# Patient Record
Sex: Male | Born: 1965 | Race: Asian | Hispanic: No | Marital: Single
Health system: Southern US, Community
[De-identification: ages and names within clinical notes are randomized; demographics above are authoritative.]

---

## 2013-02-13 DIAGNOSIS — I498 Other specified cardiac arrhythmias: Secondary | ICD-10-CM | POA: Diagnosis not present

## 2015-04-20 DIAGNOSIS — F209 Schizophrenia, unspecified: Secondary | ICD-10-CM | POA: Diagnosis not present

## 2015-05-06 DIAGNOSIS — M542 Cervicalgia: Secondary | ICD-10-CM | POA: Diagnosis not present

## 2015-05-06 DIAGNOSIS — M50323 Other cervical disc degeneration at C6-C7 level: Secondary | ICD-10-CM | POA: Diagnosis not present

## 2015-05-06 DIAGNOSIS — M47812 Spondylosis without myelopathy or radiculopathy, cervical region: Secondary | ICD-10-CM | POA: Diagnosis not present

## 2015-05-17 DIAGNOSIS — J302 Other seasonal allergic rhinitis: Secondary | ICD-10-CM | POA: Diagnosis not present

## 2015-07-22 DIAGNOSIS — H04123 Dry eye syndrome of bilateral lacrimal glands: Secondary | ICD-10-CM | POA: Diagnosis not present

## 2015-07-22 DIAGNOSIS — H43393 Other vitreous opacities, bilateral: Secondary | ICD-10-CM | POA: Diagnosis not present

## 2015-07-22 DIAGNOSIS — H33311 Horseshoe tear of retina without detachment, right eye: Secondary | ICD-10-CM | POA: Diagnosis not present

## 2015-07-22 DIAGNOSIS — H269 Unspecified cataract: Secondary | ICD-10-CM | POA: Diagnosis not present

## 2015-10-07 DIAGNOSIS — F250 Schizoaffective disorder, bipolar type: Secondary | ICD-10-CM | POA: Diagnosis not present

## 2015-10-24 DIAGNOSIS — F250 Schizoaffective disorder, bipolar type: Secondary | ICD-10-CM | POA: Diagnosis not present

## 2015-10-27 DIAGNOSIS — Z23 Encounter for immunization: Secondary | ICD-10-CM | POA: Diagnosis not present

## 2015-12-14 DIAGNOSIS — F250 Schizoaffective disorder, bipolar type: Secondary | ICD-10-CM | POA: Diagnosis not present

## 2015-12-16 DIAGNOSIS — L299 Pruritus, unspecified: Secondary | ICD-10-CM | POA: Diagnosis not present

## 2015-12-16 DIAGNOSIS — B354 Tinea corporis: Secondary | ICD-10-CM | POA: Diagnosis not present

## 2016-01-11 DIAGNOSIS — B372 Candidiasis of skin and nail: Secondary | ICD-10-CM | POA: Diagnosis not present

## 2016-01-11 DIAGNOSIS — R1031 Right lower quadrant pain: Secondary | ICD-10-CM | POA: Diagnosis not present

## 2016-01-11 DIAGNOSIS — R1011 Right upper quadrant pain: Secondary | ICD-10-CM | POA: Diagnosis not present

## 2016-01-20 DIAGNOSIS — B351 Tinea unguium: Secondary | ICD-10-CM | POA: Diagnosis not present

## 2016-01-20 DIAGNOSIS — B354 Tinea corporis: Secondary | ICD-10-CM | POA: Diagnosis not present

## 2016-02-17 DIAGNOSIS — N200 Calculus of kidney: Secondary | ICD-10-CM | POA: Diagnosis not present

## 2016-03-22 DIAGNOSIS — R319 Hematuria, unspecified: Secondary | ICD-10-CM | POA: Diagnosis not present

## 2016-03-22 DIAGNOSIS — B354 Tinea corporis: Secondary | ICD-10-CM | POA: Diagnosis not present

## 2016-04-05 DIAGNOSIS — M542 Cervicalgia: Secondary | ICD-10-CM | POA: Diagnosis not present

## 2016-05-10 DIAGNOSIS — R319 Hematuria, unspecified: Secondary | ICD-10-CM | POA: Diagnosis not present

## 2016-05-10 DIAGNOSIS — Z87828 Personal history of other (healed) physical injury and trauma: Secondary | ICD-10-CM | POA: Diagnosis not present

## 2016-05-10 DIAGNOSIS — R3129 Other microscopic hematuria: Secondary | ICD-10-CM | POA: Diagnosis not present

## 2016-05-10 DIAGNOSIS — R3916 Straining to void: Secondary | ICD-10-CM | POA: Diagnosis not present

## 2016-06-12 DIAGNOSIS — R198 Other specified symptoms and signs involving the digestive system and abdomen: Secondary | ICD-10-CM | POA: Diagnosis not present

## 2016-06-12 DIAGNOSIS — R109 Unspecified abdominal pain: Secondary | ICD-10-CM | POA: Diagnosis not present

## 2016-06-12 DIAGNOSIS — R319 Hematuria, unspecified: Secondary | ICD-10-CM | POA: Diagnosis not present

## 2016-06-12 DIAGNOSIS — I861 Scrotal varices: Secondary | ICD-10-CM | POA: Diagnosis not present

## 2016-06-12 DIAGNOSIS — R3916 Straining to void: Secondary | ICD-10-CM | POA: Diagnosis not present

## 2016-07-06 DIAGNOSIS — R350 Frequency of micturition: Secondary | ICD-10-CM | POA: Diagnosis not present

## 2016-07-12 DIAGNOSIS — M4802 Spinal stenosis, cervical region: Secondary | ICD-10-CM | POA: Diagnosis not present

## 2016-07-19 DIAGNOSIS — R3912 Poor urinary stream: Secondary | ICD-10-CM | POA: Diagnosis not present

## 2016-07-19 DIAGNOSIS — I861 Scrotal varices: Secondary | ICD-10-CM | POA: Diagnosis not present

## 2016-07-19 DIAGNOSIS — R109 Unspecified abdominal pain: Secondary | ICD-10-CM | POA: Diagnosis not present

## 2016-07-19 DIAGNOSIS — R319 Hematuria, unspecified: Secondary | ICD-10-CM | POA: Diagnosis not present

## 2016-07-19 DIAGNOSIS — R3129 Other microscopic hematuria: Secondary | ICD-10-CM | POA: Diagnosis not present

## 2016-07-19 DIAGNOSIS — N401 Enlarged prostate with lower urinary tract symptoms: Secondary | ICD-10-CM | POA: Diagnosis not present

## 2016-07-20 DIAGNOSIS — M542 Cervicalgia: Secondary | ICD-10-CM | POA: Diagnosis not present

## 2016-07-24 DIAGNOSIS — M47812 Spondylosis without myelopathy or radiculopathy, cervical region: Secondary | ICD-10-CM | POA: Diagnosis not present

## 2016-07-24 DIAGNOSIS — M5020 Other cervical disc displacement, unspecified cervical region: Secondary | ICD-10-CM | POA: Diagnosis not present

## 2016-07-24 DIAGNOSIS — M4802 Spinal stenosis, cervical region: Secondary | ICD-10-CM | POA: Diagnosis not present

## 2016-07-26 DIAGNOSIS — M4802 Spinal stenosis, cervical region: Secondary | ICD-10-CM | POA: Diagnosis not present

## 2016-07-26 DIAGNOSIS — M5412 Radiculopathy, cervical region: Secondary | ICD-10-CM | POA: Diagnosis not present

## 2016-08-06 DIAGNOSIS — R1031 Right lower quadrant pain: Secondary | ICD-10-CM | POA: Diagnosis not present

## 2016-09-07 DIAGNOSIS — B078 Other viral warts: Secondary | ICD-10-CM | POA: Diagnosis not present

## 2016-09-07 DIAGNOSIS — L309 Dermatitis, unspecified: Secondary | ICD-10-CM | POA: Diagnosis not present

## 2016-09-11 DIAGNOSIS — B369 Superficial mycosis, unspecified: Secondary | ICD-10-CM | POA: Diagnosis not present

## 2016-09-11 DIAGNOSIS — R21 Rash and other nonspecific skin eruption: Secondary | ICD-10-CM | POA: Diagnosis not present

## 2016-10-05 DIAGNOSIS — B078 Other viral warts: Secondary | ICD-10-CM | POA: Diagnosis not present

## 2016-10-17 DIAGNOSIS — R05 Cough: Secondary | ICD-10-CM | POA: Diagnosis not present

## 2016-10-17 DIAGNOSIS — R069 Unspecified abnormalities of breathing: Secondary | ICD-10-CM | POA: Diagnosis not present

## 2016-11-02 DIAGNOSIS — B079 Viral wart, unspecified: Secondary | ICD-10-CM | POA: Diagnosis not present

## 2016-12-13 DIAGNOSIS — B079 Viral wart, unspecified: Secondary | ICD-10-CM | POA: Diagnosis not present

## 2016-12-21 DIAGNOSIS — M25561 Pain in right knee: Secondary | ICD-10-CM | POA: Diagnosis not present

## 2016-12-21 DIAGNOSIS — R600 Localized edema: Secondary | ICD-10-CM | POA: Diagnosis not present

## 2016-12-21 DIAGNOSIS — I82401 Acute embolism and thrombosis of unspecified deep veins of right lower extremity: Secondary | ICD-10-CM | POA: Diagnosis not present

## 2016-12-21 DIAGNOSIS — M7989 Other specified soft tissue disorders: Secondary | ICD-10-CM | POA: Diagnosis not present

## 2016-12-24 DIAGNOSIS — Z1389 Encounter for screening for other disorder: Secondary | ICD-10-CM | POA: Diagnosis not present

## 2016-12-24 DIAGNOSIS — Z0000 Encounter for general adult medical examination without abnormal findings: Secondary | ICD-10-CM | POA: Diagnosis not present

## 2016-12-24 DIAGNOSIS — Z8679 Personal history of other diseases of the circulatory system: Secondary | ICD-10-CM | POA: Diagnosis not present

## 2017-01-01 DIAGNOSIS — R600 Localized edema: Secondary | ICD-10-CM | POA: Diagnosis not present

## 2017-01-01 DIAGNOSIS — M62838 Other muscle spasm: Secondary | ICD-10-CM | POA: Diagnosis not present

## 2017-01-01 DIAGNOSIS — S9032XD Contusion of left foot, subsequent encounter: Secondary | ICD-10-CM | POA: Diagnosis not present

## 2017-01-01 DIAGNOSIS — M7981 Nontraumatic hematoma of soft tissue: Secondary | ICD-10-CM | POA: Diagnosis not present

## 2017-01-15 DIAGNOSIS — M7989 Other specified soft tissue disorders: Secondary | ICD-10-CM | POA: Diagnosis not present

## 2017-01-15 DIAGNOSIS — M62838 Other muscle spasm: Secondary | ICD-10-CM | POA: Diagnosis not present

## 2017-01-18 DIAGNOSIS — R3912 Poor urinary stream: Secondary | ICD-10-CM | POA: Diagnosis not present

## 2017-01-18 DIAGNOSIS — N401 Enlarged prostate with lower urinary tract symptoms: Secondary | ICD-10-CM | POA: Diagnosis not present

## 2017-01-18 DIAGNOSIS — I861 Scrotal varices: Secondary | ICD-10-CM | POA: Diagnosis not present

## 2017-03-12 DIAGNOSIS — H52203 Unspecified astigmatism, bilateral: Secondary | ICD-10-CM | POA: Diagnosis not present

## 2017-03-12 DIAGNOSIS — H524 Presbyopia: Secondary | ICD-10-CM | POA: Diagnosis not present

## 2017-03-12 DIAGNOSIS — H43813 Vitreous degeneration, bilateral: Secondary | ICD-10-CM | POA: Diagnosis not present

## 2017-03-12 DIAGNOSIS — H25813 Combined forms of age-related cataract, bilateral: Secondary | ICD-10-CM | POA: Diagnosis not present

## 2017-03-12 DIAGNOSIS — H5213 Myopia, bilateral: Secondary | ICD-10-CM | POA: Diagnosis not present

## 2017-05-01 DIAGNOSIS — J302 Other seasonal allergic rhinitis: Secondary | ICD-10-CM | POA: Diagnosis not present

## 2017-05-22 DIAGNOSIS — H2511 Age-related nuclear cataract, right eye: Secondary | ICD-10-CM | POA: Diagnosis not present

## 2017-05-22 DIAGNOSIS — Z01818 Encounter for other preprocedural examination: Secondary | ICD-10-CM | POA: Diagnosis not present

## 2017-05-22 DIAGNOSIS — H353131 Nonexudative age-related macular degeneration, bilateral, early dry stage: Secondary | ICD-10-CM | POA: Diagnosis not present

## 2017-05-27 DIAGNOSIS — I872 Venous insufficiency (chronic) (peripheral): Secondary | ICD-10-CM | POA: Diagnosis not present

## 2017-05-27 DIAGNOSIS — M79661 Pain in right lower leg: Secondary | ICD-10-CM | POA: Diagnosis not present

## 2017-05-27 DIAGNOSIS — S86819D Strain of other muscle(s) and tendon(s) at lower leg level, unspecified leg, subsequent encounter: Secondary | ICD-10-CM | POA: Diagnosis not present

## 2017-05-27 DIAGNOSIS — T148XXA Other injury of unspecified body region, initial encounter: Secondary | ICD-10-CM | POA: Diagnosis not present

## 2017-05-27 DIAGNOSIS — I781 Nevus, non-neoplastic: Secondary | ICD-10-CM | POA: Diagnosis not present

## 2017-05-27 DIAGNOSIS — M7981 Nontraumatic hematoma of soft tissue: Secondary | ICD-10-CM | POA: Diagnosis not present

## 2017-05-29 DIAGNOSIS — H2511 Age-related nuclear cataract, right eye: Secondary | ICD-10-CM | POA: Diagnosis not present

## 2017-05-29 DIAGNOSIS — H25811 Combined forms of age-related cataract, right eye: Secondary | ICD-10-CM | POA: Diagnosis not present

## 2017-06-05 DIAGNOSIS — H2511 Age-related nuclear cataract, right eye: Secondary | ICD-10-CM | POA: Diagnosis not present

## 2017-06-05 DIAGNOSIS — H2512 Age-related nuclear cataract, left eye: Secondary | ICD-10-CM | POA: Diagnosis not present

## 2017-06-05 DIAGNOSIS — H25812 Combined forms of age-related cataract, left eye: Secondary | ICD-10-CM | POA: Diagnosis not present

## 2017-06-24 DIAGNOSIS — R21 Rash and other nonspecific skin eruption: Secondary | ICD-10-CM | POA: Diagnosis not present

## 2017-08-05 DIAGNOSIS — R109 Unspecified abdominal pain: Secondary | ICD-10-CM | POA: Diagnosis not present

## 2017-08-05 DIAGNOSIS — G629 Polyneuropathy, unspecified: Secondary | ICD-10-CM | POA: Diagnosis not present

## 2017-08-05 DIAGNOSIS — M62838 Other muscle spasm: Secondary | ICD-10-CM | POA: Diagnosis not present

## 2017-08-05 DIAGNOSIS — G8929 Other chronic pain: Secondary | ICD-10-CM | POA: Diagnosis not present

## 2017-08-05 DIAGNOSIS — M791 Myalgia, unspecified site: Secondary | ICD-10-CM | POA: Diagnosis not present

## 2017-08-05 DIAGNOSIS — M542 Cervicalgia: Secondary | ICD-10-CM | POA: Diagnosis not present

## 2017-08-05 DIAGNOSIS — G479 Sleep disorder, unspecified: Secondary | ICD-10-CM | POA: Diagnosis not present

## 2017-11-22 DIAGNOSIS — H04123 Dry eye syndrome of bilateral lacrimal glands: Secondary | ICD-10-CM | POA: Diagnosis not present

## 2018-01-20 DIAGNOSIS — B359 Dermatophytosis, unspecified: Secondary | ICD-10-CM | POA: Diagnosis not present

## 2018-01-20 DIAGNOSIS — R350 Frequency of micturition: Secondary | ICD-10-CM | POA: Diagnosis not present

## 2018-01-20 DIAGNOSIS — R1030 Lower abdominal pain, unspecified: Secondary | ICD-10-CM | POA: Diagnosis not present

## 2018-03-24 DIAGNOSIS — R1031 Right lower quadrant pain: Secondary | ICD-10-CM | POA: Diagnosis not present

## 2018-03-24 DIAGNOSIS — R1011 Right upper quadrant pain: Secondary | ICD-10-CM | POA: Diagnosis not present

## 2018-03-24 DIAGNOSIS — Z9049 Acquired absence of other specified parts of digestive tract: Secondary | ICD-10-CM | POA: Diagnosis not present

## 2018-03-24 DIAGNOSIS — Z1211 Encounter for screening for malignant neoplasm of colon: Secondary | ICD-10-CM | POA: Diagnosis not present

## 2018-03-24 DIAGNOSIS — M549 Dorsalgia, unspecified: Secondary | ICD-10-CM | POA: Diagnosis not present

## 2018-04-09 DIAGNOSIS — H353131 Nonexudative age-related macular degeneration, bilateral, early dry stage: Secondary | ICD-10-CM | POA: Diagnosis not present

## 2018-09-02 DIAGNOSIS — B356 Tinea cruris: Secondary | ICD-10-CM | POA: Diagnosis not present

## 2018-09-02 DIAGNOSIS — L309 Dermatitis, unspecified: Secondary | ICD-10-CM | POA: Diagnosis not present

## 2019-04-16 DIAGNOSIS — L531 Erythema annulare centrifugum: Secondary | ICD-10-CM | POA: Diagnosis not present

## 2019-04-27 DIAGNOSIS — F250 Schizoaffective disorder, bipolar type: Secondary | ICD-10-CM | POA: Diagnosis not present

## 2019-06-10 DIAGNOSIS — F250 Schizoaffective disorder, bipolar type: Secondary | ICD-10-CM | POA: Diagnosis not present

## 2019-07-20 DIAGNOSIS — R197 Diarrhea, unspecified: Secondary | ICD-10-CM | POA: Diagnosis not present

## 2019-07-20 DIAGNOSIS — R1031 Right lower quadrant pain: Secondary | ICD-10-CM | POA: Diagnosis not present

## 2019-07-21 DIAGNOSIS — R197 Diarrhea, unspecified: Secondary | ICD-10-CM | POA: Diagnosis not present

## 2019-09-17 DIAGNOSIS — F250 Schizoaffective disorder, bipolar type: Secondary | ICD-10-CM | POA: Diagnosis not present

## 2019-10-06 DIAGNOSIS — Z5181 Encounter for therapeutic drug level monitoring: Secondary | ICD-10-CM | POA: Diagnosis not present

## 2019-10-06 DIAGNOSIS — L409 Psoriasis, unspecified: Secondary | ICD-10-CM | POA: Diagnosis not present

## 2019-10-07 DIAGNOSIS — Z5181 Encounter for therapeutic drug level monitoring: Secondary | ICD-10-CM | POA: Diagnosis not present

## 2019-10-08 DIAGNOSIS — M4802 Spinal stenosis, cervical region: Secondary | ICD-10-CM | POA: Diagnosis not present

## 2019-10-14 DIAGNOSIS — M4802 Spinal stenosis, cervical region: Secondary | ICD-10-CM | POA: Diagnosis not present

## 2019-10-14 DIAGNOSIS — M4722 Other spondylosis with radiculopathy, cervical region: Secondary | ICD-10-CM | POA: Diagnosis not present

## 2019-10-14 DIAGNOSIS — M4312 Spondylolisthesis, cervical region: Secondary | ICD-10-CM | POA: Diagnosis not present

## 2019-10-14 DIAGNOSIS — M4693 Unspecified inflammatory spondylopathy, cervicothoracic region: Secondary | ICD-10-CM | POA: Diagnosis not present

## 2019-10-21 DIAGNOSIS — M5030 Other cervical disc degeneration, unspecified cervical region: Secondary | ICD-10-CM | POA: Diagnosis not present

## 2019-10-21 DIAGNOSIS — M4802 Spinal stenosis, cervical region: Secondary | ICD-10-CM | POA: Diagnosis not present

## 2019-10-22 DIAGNOSIS — F250 Schizoaffective disorder, bipolar type: Secondary | ICD-10-CM | POA: Diagnosis not present

## 2019-11-10 DIAGNOSIS — Z20822 Contact with and (suspected) exposure to covid-19: Secondary | ICD-10-CM | POA: Diagnosis not present

## 2019-11-10 DIAGNOSIS — Z1152 Encounter for screening for COVID-19: Secondary | ICD-10-CM | POA: Diagnosis not present

## 2019-12-02 DIAGNOSIS — M542 Cervicalgia: Secondary | ICD-10-CM | POA: Diagnosis not present

## 2019-12-02 DIAGNOSIS — M5412 Radiculopathy, cervical region: Secondary | ICD-10-CM | POA: Diagnosis not present

## 2020-01-20 DIAGNOSIS — L409 Psoriasis, unspecified: Secondary | ICD-10-CM | POA: Diagnosis not present

## 2020-01-20 DIAGNOSIS — Z79899 Other long term (current) drug therapy: Secondary | ICD-10-CM | POA: Diagnosis not present

## 2020-01-20 DIAGNOSIS — Z5181 Encounter for therapeutic drug level monitoring: Secondary | ICD-10-CM | POA: Diagnosis not present

## 2020-06-28 DIAGNOSIS — R52 Pain, unspecified: Secondary | ICD-10-CM | POA: Diagnosis not present

## 2020-06-28 DIAGNOSIS — W57XXXA Bitten or stung by nonvenomous insect and other nonvenomous arthropods, initial encounter: Secondary | ICD-10-CM | POA: Diagnosis not present

## 2020-06-28 DIAGNOSIS — R110 Nausea: Secondary | ICD-10-CM | POA: Diagnosis not present

## 2020-07-06 DIAGNOSIS — F250 Schizoaffective disorder, bipolar type: Secondary | ICD-10-CM | POA: Diagnosis not present

## 2020-07-07 DIAGNOSIS — M5412 Radiculopathy, cervical region: Secondary | ICD-10-CM | POA: Diagnosis not present

## 2020-07-07 DIAGNOSIS — G959 Disease of spinal cord, unspecified: Secondary | ICD-10-CM | POA: Diagnosis not present

## 2020-09-09 ENCOUNTER — Emergency Department (HOSPITAL_COMMUNITY)
Admission: EM | Admit: 2020-09-09 | Discharge: 2020-09-09 | Disposition: A | Discharge status: Home / Self Care | Payer: Medicare PPO | Attending: Emergency Medicine | Admitting: Emergency Medicine

## 2020-09-09 ENCOUNTER — Emergency Department (HOSPITAL_COMMUNITY)
Admission: EM | Admit: 2020-09-09 | Discharge: 2020-09-09 | Disposition: A | Discharge status: Home / Self Care | Payer: Medicare PPO | Source: Home / Self Care | Attending: Emergency Medicine | Admitting: Emergency Medicine

## 2020-09-09 ENCOUNTER — Emergency Department (HOSPITAL_COMMUNITY): Payer: Medicare PPO

## 2020-09-09 ENCOUNTER — Other Ambulatory Visit: Payer: Self-pay

## 2020-09-09 ENCOUNTER — Encounter (HOSPITAL_COMMUNITY): Payer: Self-pay

## 2020-09-09 DIAGNOSIS — R112 Nausea with vomiting, unspecified: Secondary | ICD-10-CM | POA: Insufficient documentation

## 2020-09-09 DIAGNOSIS — N132 Hydronephrosis with renal and ureteral calculous obstruction: Secondary | ICD-10-CM | POA: Diagnosis not present

## 2020-09-09 DIAGNOSIS — Z5321 Procedure and treatment not carried out due to patient leaving prior to being seen by health care provider: Secondary | ICD-10-CM | POA: Insufficient documentation

## 2020-09-09 DIAGNOSIS — N200 Calculus of kidney: Secondary | ICD-10-CM | POA: Insufficient documentation

## 2020-09-09 DIAGNOSIS — Z9049 Acquired absence of other specified parts of digestive tract: Secondary | ICD-10-CM | POA: Diagnosis not present

## 2020-09-09 DIAGNOSIS — R1030 Lower abdominal pain, unspecified: Secondary | ICD-10-CM | POA: Diagnosis not present

## 2020-09-09 DIAGNOSIS — Z20822 Contact with and (suspected) exposure to covid-19: Secondary | ICD-10-CM | POA: Insufficient documentation

## 2020-09-09 DIAGNOSIS — R300 Dysuria: Secondary | ICD-10-CM | POA: Diagnosis not present

## 2020-09-09 LAB — CBC WITH DIFFERENTIAL/PLATELET
Abs Immature Granulocytes: 0.06 10*3/uL (ref 0.00–0.07)
Basophils Absolute: 0 10*3/uL (ref 0.0–0.1)
Basophils Relative: 0 %
Eosinophils Absolute: 0 10*3/uL (ref 0.0–0.5)
Eosinophils Relative: 0 %
HCT: 47.4 % (ref 39.0–52.0)
Hemoglobin: 15.6 g/dL (ref 13.0–17.0)
Immature Granulocytes: 1 %
Lymphocytes Relative: 8 %
Lymphs Abs: 0.8 10*3/uL (ref 0.7–4.0)
MCH: 29.9 pg (ref 26.0–34.0)
MCHC: 32.9 g/dL (ref 30.0–36.0)
MCV: 90.8 fL (ref 80.0–100.0)
Monocytes Absolute: 0.5 10*3/uL (ref 0.1–1.0)
Monocytes Relative: 5 %
Neutro Abs: 8.7 10*3/uL — ABNORMAL HIGH (ref 1.7–7.7)
Neutrophils Relative %: 86 %
Platelets: 296 10*3/uL (ref 150–400)
RBC: 5.22 MIL/uL (ref 4.22–5.81)
RDW: 12.6 % (ref 11.5–15.5)
WBC: 10.1 10*3/uL (ref 4.0–10.5)
nRBC: 0 % (ref 0.0–0.2)

## 2020-09-09 LAB — URINALYSIS, ROUTINE W REFLEX MICROSCOPIC
Bilirubin Urine: NEGATIVE
Glucose, UA: NEGATIVE mg/dL
Hgb urine dipstick: NEGATIVE
Ketones, ur: NEGATIVE mg/dL
Leukocytes,Ua: NEGATIVE
Nitrite: NEGATIVE
Protein, ur: NEGATIVE mg/dL
Specific Gravity, Urine: 1.02 (ref 1.005–1.030)
pH: 8 (ref 5.0–8.0)

## 2020-09-09 LAB — COMPREHENSIVE METABOLIC PANEL
ALT: 24 U/L (ref 0–44)
AST: 22 U/L (ref 15–41)
Albumin: 4.1 g/dL (ref 3.5–5.0)
Alkaline Phosphatase: 64 U/L (ref 38–126)
Anion gap: 8 (ref 5–15)
BUN: 19 mg/dL (ref 6–20)
CO2: 28 mmol/L (ref 22–32)
Calcium: 9 mg/dL (ref 8.9–10.3)
Chloride: 100 mmol/L (ref 98–111)
Creatinine, Ser: 1.51 mg/dL — ABNORMAL HIGH (ref 0.61–1.24)
GFR, Estimated: 55 mL/min — ABNORMAL LOW (ref >60)
Glucose, Bld: 167 mg/dL — ABNORMAL HIGH (ref 70–99)
Potassium: 4.1 mmol/L (ref 3.5–5.1)
Sodium: 136 mmol/L (ref 135–145)
Total Bilirubin: 0.9 mg/dL (ref 0.3–1.2)
Total Protein: 7.1 g/dL (ref 6.5–8.1)

## 2020-09-09 LAB — LIPASE, BLOOD: Lipase: 60 U/L — ABNORMAL HIGH (ref 11–51)

## 2020-09-09 MED ORDER — TAMSULOSIN HCL 0.4 MG PO CAPS: 0.4000 mg | ORAL_CAPSULE | Freq: Two times a day (BID) | ORAL | 0 refills | Status: AC

## 2020-09-09 MED ORDER — ONDANSETRON 4 MG PO TBDP: 4.0000 mg | ORAL_TABLET | Freq: Three times a day (TID) | ORAL | 0 refills | Status: AC | PRN

## 2020-09-09 MED ORDER — KETOROLAC TROMETHAMINE 10 MG PO TABS: 10.0000 mg | ORAL_TABLET | Freq: Four times a day (QID) | ORAL | 0 refills | Status: AC | PRN

## 2020-09-09 NOTE — ED Triage Notes (Signed)
Pt reports lower abd pain for the past 2 days that now radiates to his lower back. Pt also reports dysuria. Pt thinks it is a kidney stone but denies hx of kidney stones. Pt also having nausea, emesisx1

## 2020-09-09 NOTE — ED Notes (Signed)
Pt states that he wants to leave and come back at another time. Explained to the pt that it is recommended that he stay to be seen now. Pt states that his ride expires at 9 and he cannot stay. Pt seen leaving ED entrance.

## 2020-09-09 NOTE — ED Provider Notes (Signed)
Emergency Medicine Provider Triage Evaluation Note  Brett Schmidt , a 55 y.o. male  was evaluated in triage.  Pt complains of left flank and left lower abdominal pain that started last week. Reports dysuria as well.  Review of Systems  Positive: Left flank and left lower abd pain, dysuria Negative: fever  Physical Exam  BP (!) 158/99   Pulse 69   Temp 98.4 F (36.9 C) (Oral)   Resp 18   Ht 5\' 8"  (1.727 m)   Wt 89.8 kg   SpO2 100%   BMI 30.11 kg/m  Gen:   Awake, no distress   Resp:  Normal effort  MSK:   Moves extremities without difficulty   Medical Decision Making  Medically screening exam initiated at 3:59 PM.  Appropriate orders placed.  was informed that the remainder of the evaluation will be completed by another provider, this initial triage assessment does not replace that evaluation, and the importance of remaining in the ED until their evaluation is complete.    Germain Osgood, PA-C 09/09/20 1600    09/11/20, MD 09/10/20 1258

## 2020-09-09 NOTE — ED Triage Notes (Signed)
Pt returns to ED to complete evaluation earlier today.   Continues to c/o left flank pain and abdominal pain/ HX kidney stones

## 2020-09-09 NOTE — ED Provider Notes (Signed)
[facility] EMERGENCY DEPARTMENT Provider Note   CSN: [id number] Arrival date & time: 09/09/20  2221     History Chief Complaint  Patient presents with   Flank Pain    Brett Schmidt is a 55 y.o. male.  Patient presents to the emergency department with a chief complaint of left-sided flank pain.  He reports that the symptoms started last week.  He reports associated dysuria.  States that he has had subjective fevers and chills, but has not ever had an elevated temperature by thermometer.  He reports some mild associated nausea.  Has tried taking over-the-counter pain medication with some relief.  Was seen earlier today in triage, but left prior to completing his evaluation.  The history is provided by the patient. No language interpreter was used.      No past medical history on file.  There are no problems to display for this patient.   No past surgical history on file.     No family history on file.     Home Medications Prior to Admission medications   Not on File    Allergies    Patient has no allergy information on record.  Review of Systems   Review of Systems  All other systems reviewed and are negative.  Physical Exam Updated Vital Signs BP (!) 162/108 (BP Location: Left Arm)   Pulse 100   Temp 99.3 F (37.4 C) (Oral)   Resp 20   Ht 5\' 8"  (1.727 m)   Wt 89.8 kg   SpO2 96%   BMI 30.11 kg/m   Physical Exam Vitals and nursing note reviewed.  Constitutional:      Appearance: He is well-developed.  HENT:     Head: Normocephalic and atraumatic.  Eyes:     Conjunctiva/sclera: Conjunctivae normal.  Cardiovascular:     Rate and Rhythm: Normal rate.     Heart sounds: No murmur heard. Pulmonary:     Effort: Pulmonary effort is normal. No respiratory distress.     Breath sounds: Normal breath sounds.  Abdominal:     Palpations: Abdomen is soft.     Tenderness: There is no abdominal tenderness.     Comments: Left CVA tenderness   Musculoskeletal:     Cervical back: Neck supple.  Skin:    General: Skin is warm and dry.  Neurological:     Mental Status: He is alert and oriented to person, place, and time.  Psychiatric:        Mood and Affect: Mood normal.        Behavior: Behavior normal.    ED Results / Procedures / Treatments   Labs (all labs ordered are listed, but only abnormal results are displayed) Labs Reviewed - No data to display  EKG None  Radiology CT Renal Stone Study  Result Date: 09/09/2020 CLINICAL DATA:  Lower abdominal pain radiating to back EXAM: CT ABDOMEN AND PELVIS WITHOUT CONTRAST TECHNIQUE: Multidetector CT imaging of the abdomen and pelvis was performed following the standard protocol without IV contrast. COMPARISON:  None. FINDINGS: Lower chest: Lung bases are clear. No effusions. Heart is normal size. Hepatobiliary: No focal liver abnormality is seen. Status post cholecystectomy. No biliary dilatation. Pancreas: No focal abnormality or ductal dilatation. Spleen: No focal abnormality.  Normal size. Adrenals/Urinary Tract: Mild left hydronephrosis due to to 2 mm distal left ureteral stone. No stones or hydronephrosis on the right. Adrenal glands and urinary bladder unremarkable. Stomach/Bowel: Stomach, large and small bowel grossly unremarkable. Vascular/Lymphatic:  No evidence of aneurysm or adenopathy. Reproductive: No visible focal abnormality. Other: No free fluid or free air. Musculoskeletal: No acute bony abnormality. IMPRESSION: 2 mm distal left ureteral stone with mild left hydronephrosis. Electronically Signed   By: Charlett Nose M.D.   On: 09/09/2020 16:52    Procedures Procedures   Medications Ordered in ED Medications - No data to display  ED Course  I have reviewed the triage vital signs and the nursing notes.  Pertinent labs & imaging results that were available during my care of the patient were reviewed by me and considered in my medical decision making (see chart for  details).    MDM Rules/Calculators/A&P                           Patient here with left-sided flank pain.  Was seen earlier.  CT renal study shows 2 millimeter distal left ureteral stone with mild left hydronephrosis.  Creatinine is 1.5.  Patient is afebrile. Urinalysis is inconsistent with infection.  Patient offered Percocet, but declines.  States that he will take Tylenol or ibuprofen.  I also prescribed him some p.o. Toradol along with Zofran and Flomax.  We will have patient follow-up with his doctor and/or urology.  Return precautions discussed. Final Clinical Impression(s) / ED Diagnoses Final diagnoses:  Kidney stone    Rx / DC Orders ED Discharge Orders     None        Roxy Horseman, PA-C 09/09/20 2242    Rolan Bucco, MD 09/09/20 2328

## 2020-09-10 DIAGNOSIS — I878 Other specified disorders of veins: Secondary | ICD-10-CM | POA: Diagnosis not present

## 2020-09-10 DIAGNOSIS — N135 Crossing vessel and stricture of ureter without hydronephrosis: Secondary | ICD-10-CM | POA: Diagnosis not present

## 2020-09-10 DIAGNOSIS — Z951 Presence of aortocoronary bypass graft: Secondary | ICD-10-CM | POA: Diagnosis not present

## 2020-09-10 DIAGNOSIS — Z79899 Other long term (current) drug therapy: Secondary | ICD-10-CM | POA: Diagnosis not present

## 2020-09-10 DIAGNOSIS — I2510 Atherosclerotic heart disease of native coronary artery without angina pectoris: Secondary | ICD-10-CM | POA: Diagnosis not present

## 2020-09-10 DIAGNOSIS — N201 Calculus of ureter: Secondary | ICD-10-CM | POA: Diagnosis not present

## 2020-09-10 DIAGNOSIS — M5137 Other intervertebral disc degeneration, lumbosacral region: Secondary | ICD-10-CM | POA: Diagnosis not present

## 2020-09-10 DIAGNOSIS — Z87442 Personal history of urinary calculi: Secondary | ICD-10-CM | POA: Diagnosis not present

## 2020-09-10 DIAGNOSIS — N132 Hydronephrosis with renal and ureteral calculous obstruction: Secondary | ICD-10-CM | POA: Diagnosis not present

## 2020-09-10 DIAGNOSIS — N179 Acute kidney failure, unspecified: Secondary | ICD-10-CM | POA: Diagnosis not present

## 2020-09-10 DIAGNOSIS — N1330 Unspecified hydronephrosis: Secondary | ICD-10-CM | POA: Diagnosis not present

## 2020-09-10 DIAGNOSIS — Z9889 Other specified postprocedural states: Secondary | ICD-10-CM | POA: Diagnosis not present

## 2020-09-10 LAB — RESP PANEL BY RT-PCR (FLU A&B, COVID) ARPGX2
Influenza A by PCR: NEGATIVE
Influenza B by PCR: NEGATIVE
SARS Coronavirus 2 by RT PCR: NEGATIVE

## 2020-09-13 DIAGNOSIS — N200 Calculus of kidney: Secondary | ICD-10-CM | POA: Diagnosis not present

## 2020-09-19 DIAGNOSIS — N201 Calculus of ureter: Secondary | ICD-10-CM | POA: Diagnosis not present

## 2020-09-26 DIAGNOSIS — N201 Calculus of ureter: Secondary | ICD-10-CM | POA: Diagnosis not present

## 2021-01-10 DIAGNOSIS — F250 Schizoaffective disorder, bipolar type: Secondary | ICD-10-CM | POA: Diagnosis not present

## 2021-01-30 DIAGNOSIS — R109 Unspecified abdominal pain: Secondary | ICD-10-CM | POA: Diagnosis not present

## 2021-02-17 DIAGNOSIS — R5383 Other fatigue: Secondary | ICD-10-CM | POA: Diagnosis not present

## 2021-02-17 DIAGNOSIS — Z6830 Body mass index (BMI) 30.0-30.9, adult: Secondary | ICD-10-CM | POA: Diagnosis not present

## 2021-02-17 DIAGNOSIS — Z0000 Encounter for general adult medical examination without abnormal findings: Secondary | ICD-10-CM | POA: Diagnosis not present

## 2021-02-17 DIAGNOSIS — R1084 Generalized abdominal pain: Secondary | ICD-10-CM | POA: Diagnosis not present

## 2021-02-17 DIAGNOSIS — E559 Vitamin D deficiency, unspecified: Secondary | ICD-10-CM | POA: Diagnosis not present

## 2021-02-24 DIAGNOSIS — E781 Pure hyperglyceridemia: Secondary | ICD-10-CM | POA: Diagnosis not present

## 2021-02-24 DIAGNOSIS — Z6829 Body mass index (BMI) 29.0-29.9, adult: Secondary | ICD-10-CM | POA: Diagnosis not present

## 2021-02-24 DIAGNOSIS — Z1211 Encounter for screening for malignant neoplasm of colon: Secondary | ICD-10-CM | POA: Diagnosis not present

## 2021-02-24 DIAGNOSIS — E559 Vitamin D deficiency, unspecified: Secondary | ICD-10-CM | POA: Diagnosis not present

## 2021-02-24 DIAGNOSIS — R030 Elevated blood-pressure reading, without diagnosis of hypertension: Secondary | ICD-10-CM | POA: Diagnosis not present

## 2021-02-24 DIAGNOSIS — R7303 Prediabetes: Secondary | ICD-10-CM | POA: Diagnosis not present

## 2021-10-23 IMAGING — CT CT RENAL STONE PROTOCOL
2 of 4 series · 17 of 46 positions shown, 19 images · non-contrast
Comparison: None.

CLINICAL DATA: Lower abdominal pain radiating to back

EXAM:
CT ABDOMEN AND PELVIS WITHOUT CONTRAST
TECHNIQUE: Multidetector CT imaging of the abdomen and pelvis was performed
following the standard protocol without IV contrast.

[Series 3: renal stone 5.0 · axial · 0.75mm/px · z∈[+855,+1315]mm · 14 of 102 slices shown, 16 images]
[im 5/102  soft-tissue]
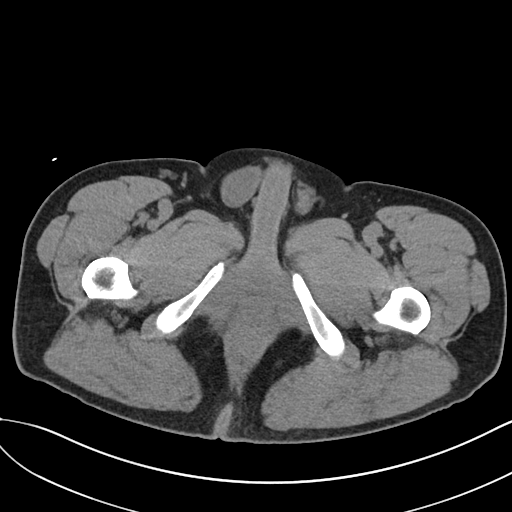
[im 5/102  bone]
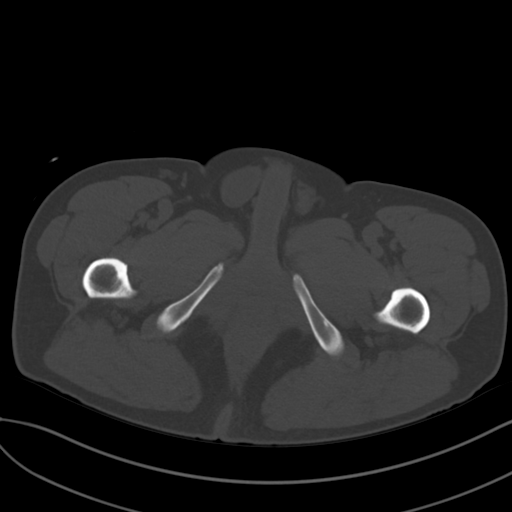
[im 13/102  soft-tissue]
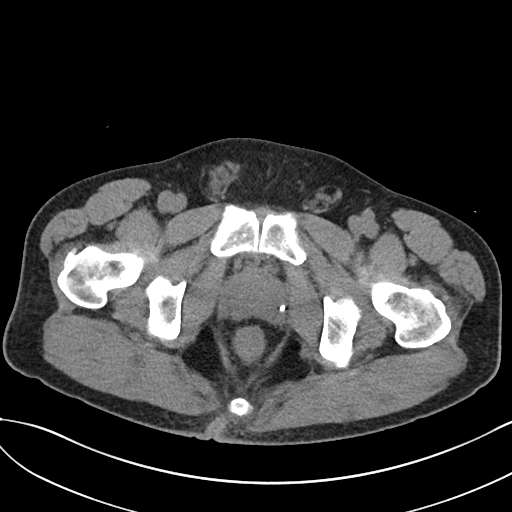
[im 22/102  soft-tissue]
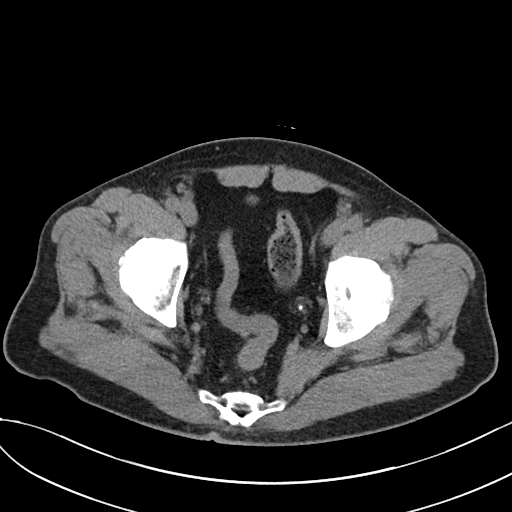
[im 26/102  soft-tissue]
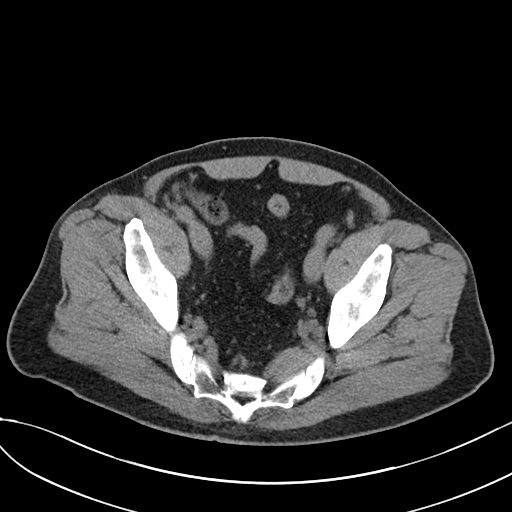
[im 34/102  soft-tissue]
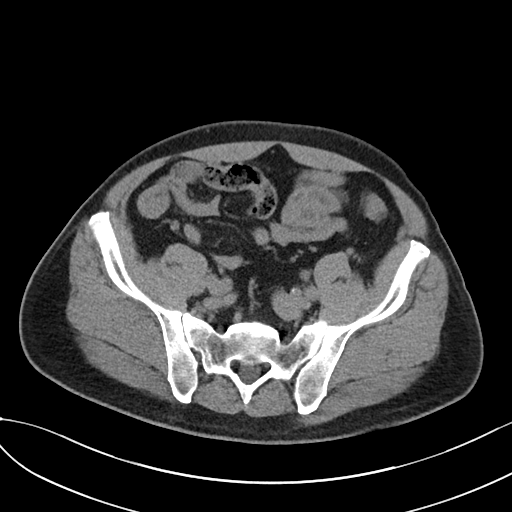
[im 43/102  soft-tissue]
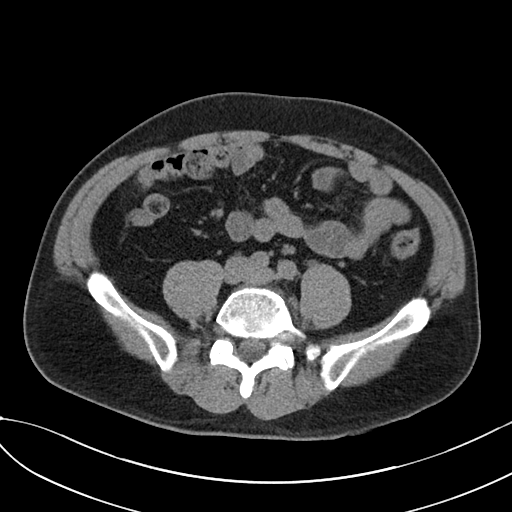
[im 47/102  soft-tissue]
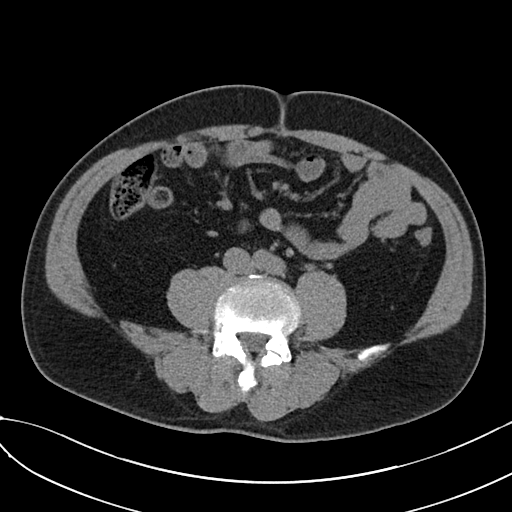
[im 55/102  soft-tissue]
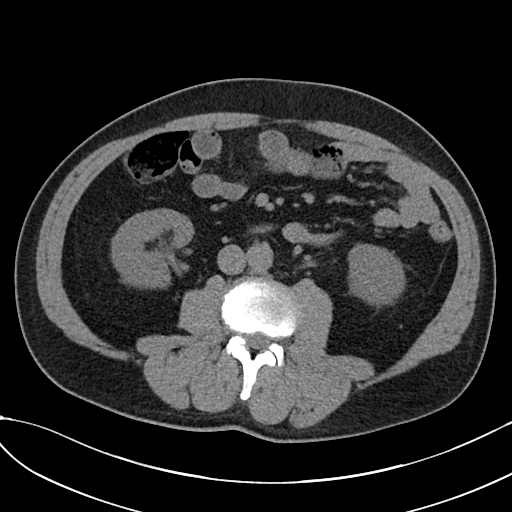
[im 59/102  soft-tissue]
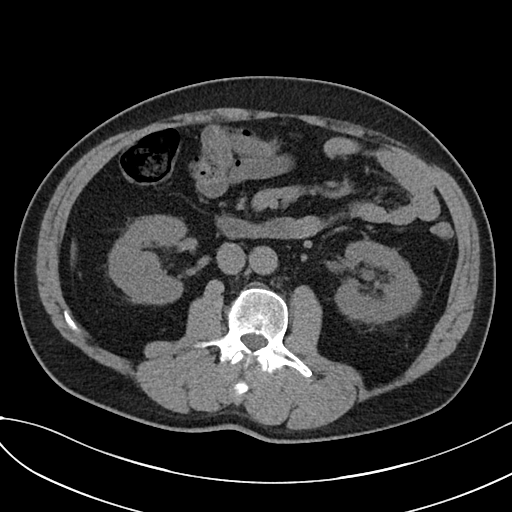
[im 59/102  bone]
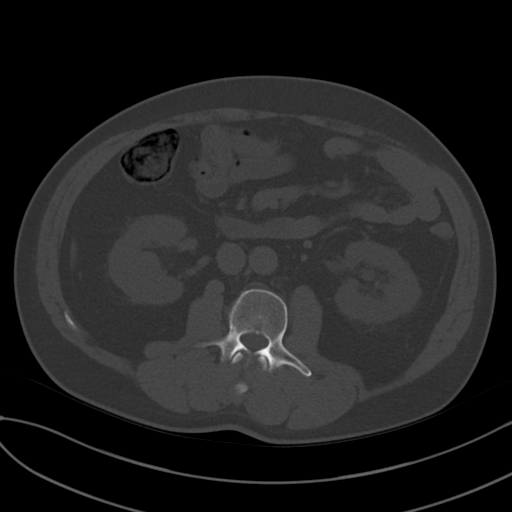
[im 68/102  soft-tissue]
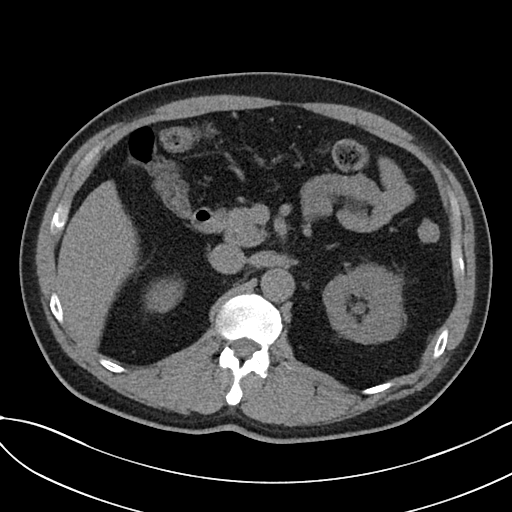
[im 76/102  soft-tissue]
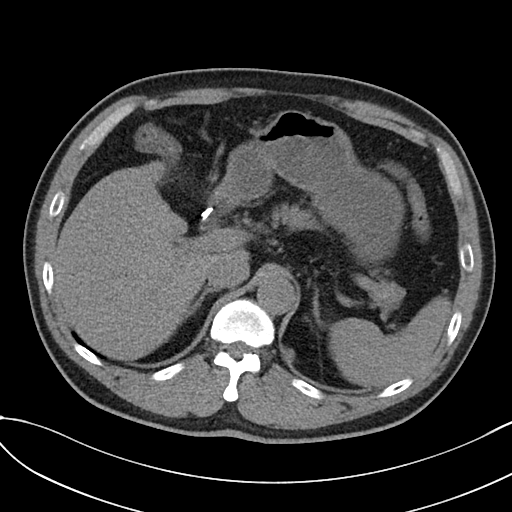
[im 80/102  soft-tissue]
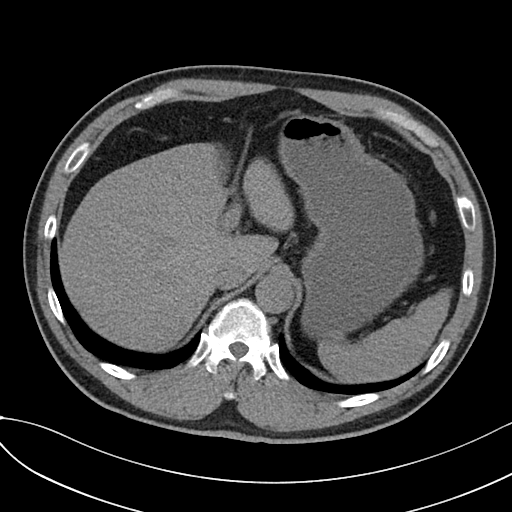
[im 89/102  soft-tissue]
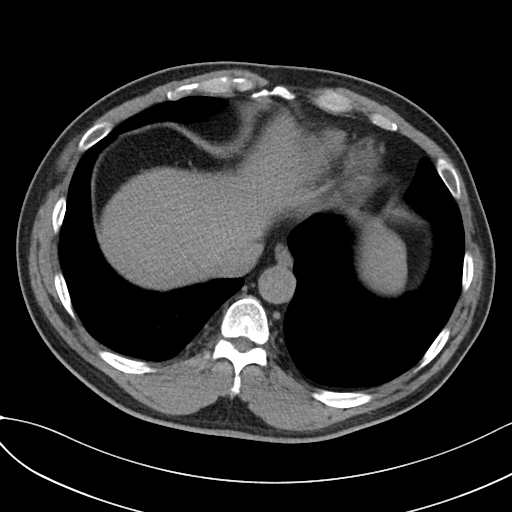
[im 97/102  soft-tissue]
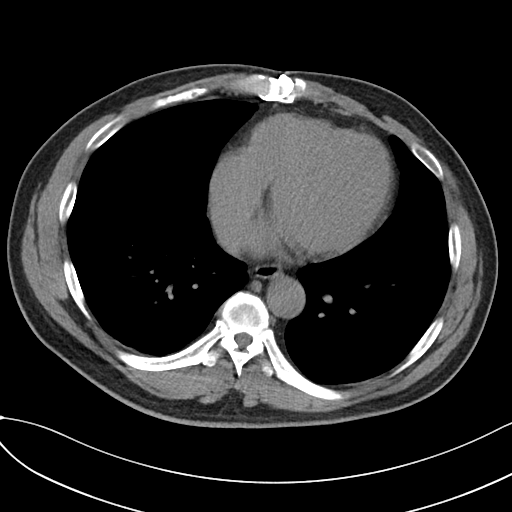

[Series 6: cor · coronal · 0.80mm/px · 3 of 149 slices shown]
[im 50/149  soft-tissue]
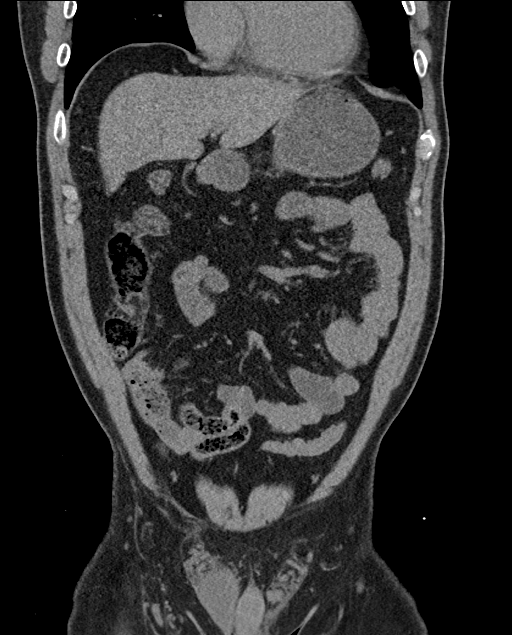
[im 66/149  soft-tissue]
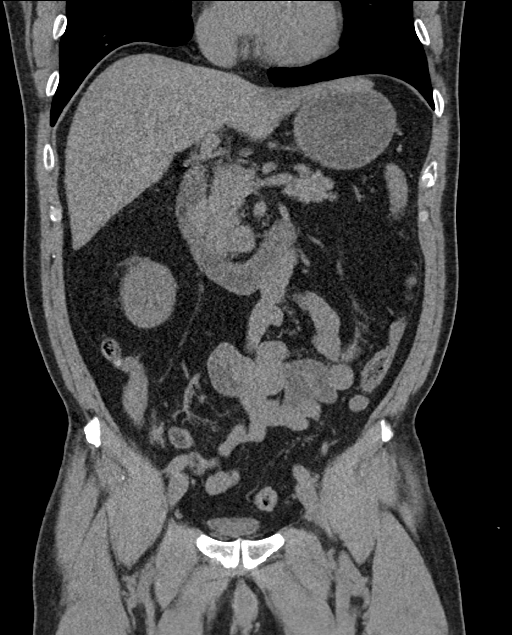
[im 83/149  soft-tissue]
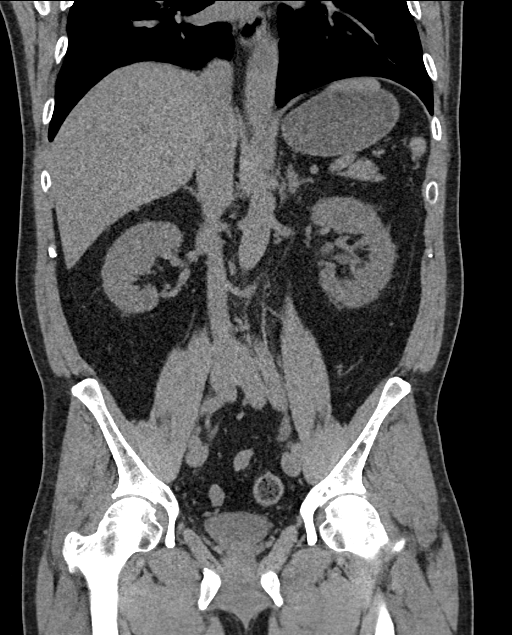

[17 of 46 positions shown; findings below may reference images not displayed]

FINDINGS: Lower chest: Lung bases are clear. No effusions. Heart is normal
size.

Hepatobiliary: No focal liver abnormality is seen. Status post
cholecystectomy. No biliary dilatation.

Pancreas: No focal abnormality or ductal dilatation.

Spleen: No focal abnormality.  Normal size.

Adrenals/Urinary Tract: Mild left hydronephrosis due to to 2 mm
distal left ureteral stone. No stones or hydronephrosis on the
right. Adrenal glands and urinary bladder unremarkable.

Stomach/Bowel: Stomach, large and small bowel grossly unremarkable.

Vascular/Lymphatic: No evidence of aneurysm or adenopathy.

Reproductive: No visible focal abnormality.

Other: No free fluid or free air.

Musculoskeletal: No acute bony abnormality.
IMPRESSION: 2 mm distal left ureteral stone with mild left hydronephrosis.
